# Patient Record
Sex: Female | Born: 1980 | Hispanic: Yes | Marital: Married | Smoking: Never smoker
Health system: Southern US, Community
[De-identification: ages and names within clinical notes are randomized; demographics above are authoritative.]

---

## 2005-05-09 ENCOUNTER — Emergency Department: Payer: Self-pay | Admitting: Unknown Physician Specialty

## 2005-07-19 ENCOUNTER — Ambulatory Visit: Payer: Self-pay | Admitting: Family Medicine

## 2005-10-30 ENCOUNTER — Ambulatory Visit: Payer: Self-pay

## 2005-12-25 ENCOUNTER — Inpatient Hospital Stay: Payer: Self-pay

## 2008-07-06 ENCOUNTER — Emergency Department: Payer: Self-pay | Admitting: Emergency Medicine

## 2012-12-16 ENCOUNTER — Ambulatory Visit: Payer: Self-pay | Admitting: Otolaryngology

## 2013-09-12 ENCOUNTER — Ambulatory Visit: Payer: Self-pay | Admitting: Urology

## 2015-10-22 ENCOUNTER — Other Ambulatory Visit: Payer: Self-pay | Admitting: Internal Medicine

## 2015-10-22 DIAGNOSIS — Z1231 Encounter for screening mammogram for malignant neoplasm of breast: Secondary | ICD-10-CM

## 2015-10-26 ENCOUNTER — Ambulatory Visit: Payer: Self-pay

## 2015-11-09 ENCOUNTER — Other Ambulatory Visit: Payer: Self-pay | Admitting: Internal Medicine

## 2015-11-09 ENCOUNTER — Ambulatory Visit
Admission: RE | Admit: 2015-11-09 | Discharge: 2015-11-09 | Disposition: A | Discharge status: Home / Self Care | Payer: 59 | Source: Ambulatory Visit | Attending: Internal Medicine | Admitting: Internal Medicine

## 2015-11-09 DIAGNOSIS — Z1231 Encounter for screening mammogram for malignant neoplasm of breast: Secondary | ICD-10-CM | POA: Insufficient documentation

## 2015-11-17 ENCOUNTER — Other Ambulatory Visit: Payer: Self-pay | Admitting: *Deleted

## 2015-11-17 ENCOUNTER — Inpatient Hospital Stay
Admission: RE | Admit: 2015-11-17 | Discharge: 2015-11-17 | Disposition: A | Discharge status: Home / Self Care | Payer: Self-pay | Source: Ambulatory Visit | Attending: *Deleted | Admitting: *Deleted

## 2015-11-17 DIAGNOSIS — Z9289 Personal history of other medical treatment: Secondary | ICD-10-CM

## 2015-11-19 ENCOUNTER — Other Ambulatory Visit: Payer: Self-pay | Admitting: Internal Medicine

## 2015-11-19 DIAGNOSIS — N6320 Unspecified lump in the left breast, unspecified quadrant: Secondary | ICD-10-CM

## 2015-11-23 ENCOUNTER — Ambulatory Visit
Admission: RE | Admit: 2015-11-23 | Discharge: 2015-11-23 | Disposition: A | Discharge status: Home / Self Care | Payer: 59 | Source: Ambulatory Visit | Attending: Internal Medicine | Admitting: Internal Medicine

## 2015-11-23 DIAGNOSIS — N6320 Unspecified lump in the left breast, unspecified quadrant: Secondary | ICD-10-CM

## 2015-11-23 DIAGNOSIS — N6321 Unspecified lump in the left breast, upper outer quadrant: Secondary | ICD-10-CM | POA: Diagnosis not present

## 2015-12-14 ENCOUNTER — Other Ambulatory Visit: Payer: Self-pay | Admitting: Internal Medicine

## 2015-12-14 DIAGNOSIS — N6320 Unspecified lump in the left breast, unspecified quadrant: Secondary | ICD-10-CM

## 2015-12-24 ENCOUNTER — Ambulatory Visit
Admission: RE | Admit: 2015-12-24 | Discharge: 2015-12-24 | Disposition: A | Discharge status: Home / Self Care | Payer: 59 | Source: Ambulatory Visit | Attending: Internal Medicine | Admitting: Internal Medicine

## 2015-12-24 DIAGNOSIS — N6320 Unspecified lump in the left breast, unspecified quadrant: Secondary | ICD-10-CM

## 2015-12-24 DIAGNOSIS — D242 Benign neoplasm of left breast: Secondary | ICD-10-CM | POA: Insufficient documentation

## 2015-12-24 HISTORY — PX: BREAST BIOPSY: SHX20

## 2015-12-28 LAB — SURGICAL PATHOLOGY

## 2016-06-07 ENCOUNTER — Other Ambulatory Visit: Payer: Self-pay | Admitting: Internal Medicine

## 2016-06-07 DIAGNOSIS — Z87898 Personal history of other specified conditions: Secondary | ICD-10-CM

## 2016-06-09 ENCOUNTER — Other Ambulatory Visit: Payer: Self-pay | Admitting: Internal Medicine

## 2016-06-09 DIAGNOSIS — D242 Benign neoplasm of left breast: Secondary | ICD-10-CM

## 2016-06-30 ENCOUNTER — Ambulatory Visit
Admission: RE | Admit: 2016-06-30 | Discharge: 2016-06-30 | Disposition: A | Discharge status: Home / Self Care | Payer: 59 | Source: Ambulatory Visit | Attending: Internal Medicine | Admitting: Internal Medicine

## 2016-06-30 DIAGNOSIS — D242 Benign neoplasm of left breast: Secondary | ICD-10-CM

## 2017-02-10 IMAGING — US US BREAST*L* LIMITED INC AXILLA
1 series · 13 of 25 positions shown · non-contrast
Comparison: Previous exam(s).

CLINICAL DATA: The patient was called back from screening
mammography due to a left breast mass.

EXAM:
2D DIGITAL DIAGNOSTIC LEFT MAMMOGRAM WITH ADJUNCT TOMO
ULTRASOUND LEFT BREAST

[Series 1: us breast*left* limited inc axilla · 0.07mm/px · 13 of 32 slices shown]
[im 1/32]
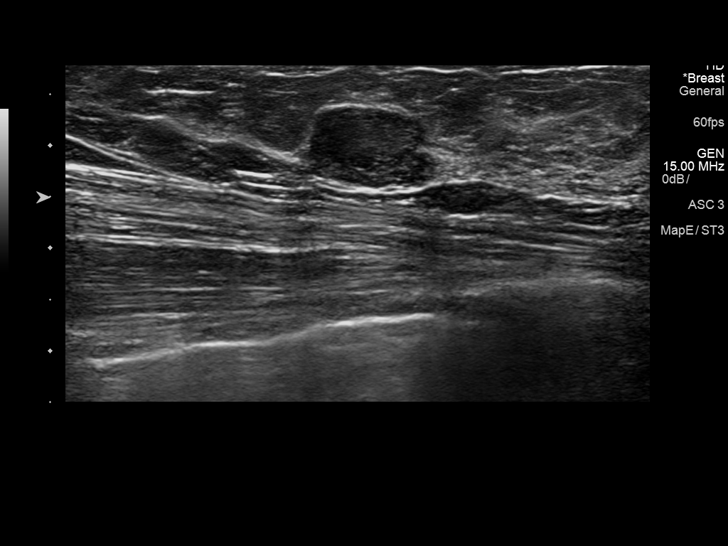
[im 3/32]
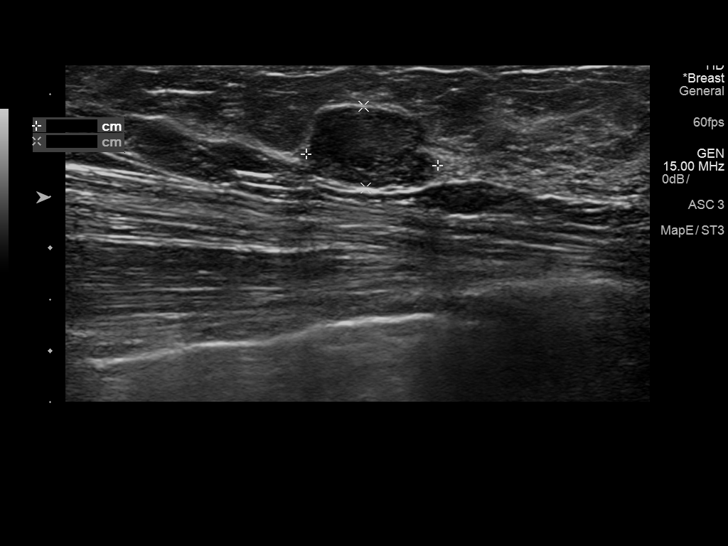
[im 6/32]
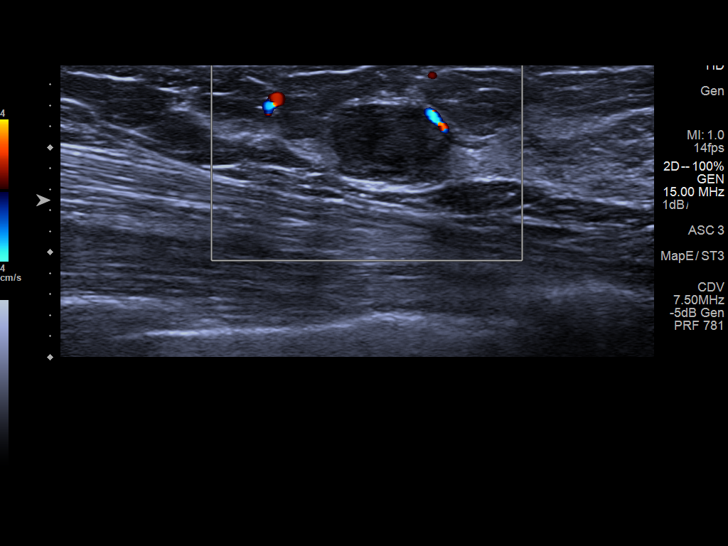
[im 8/32]
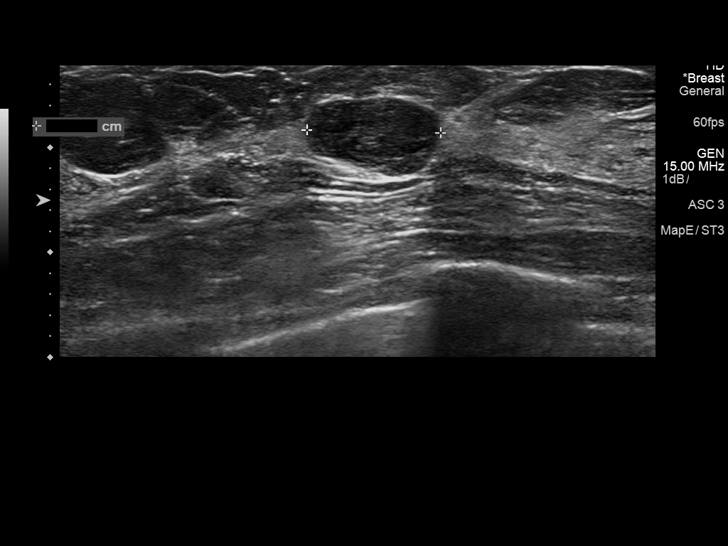
[im 11/32]
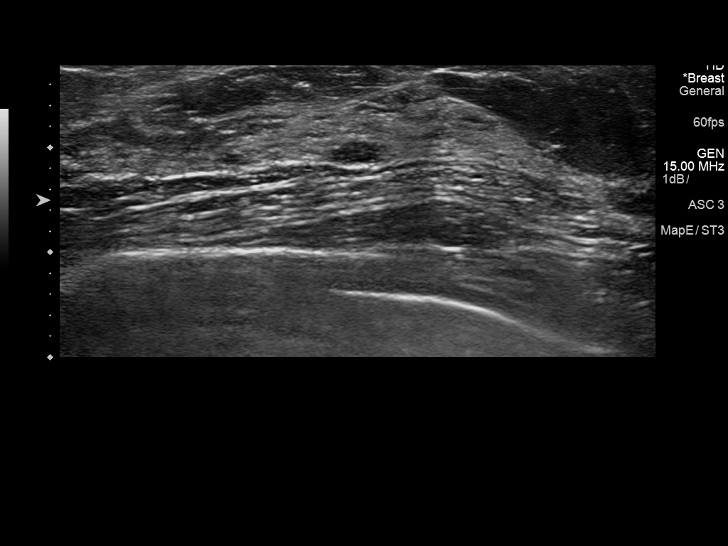
[im 13/32]
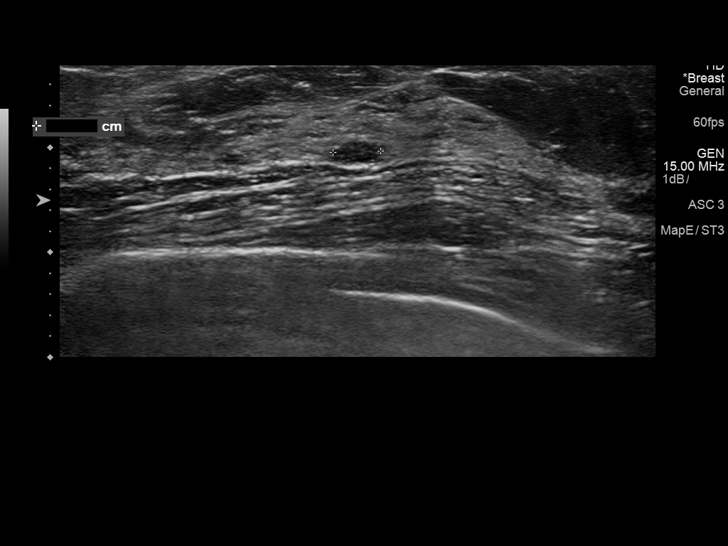
[im 16/32]
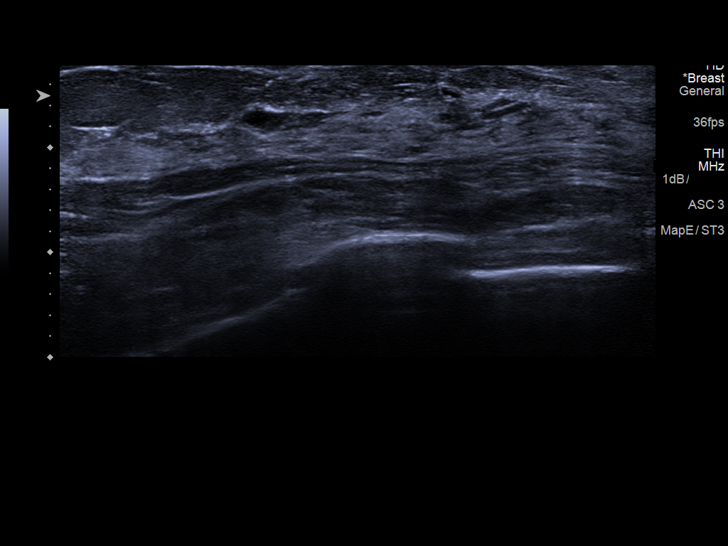
[im 19/32]
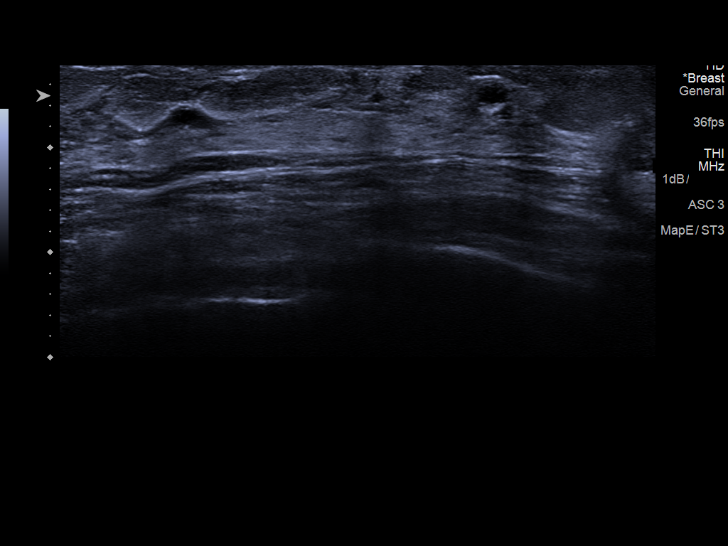
[im 21/32]
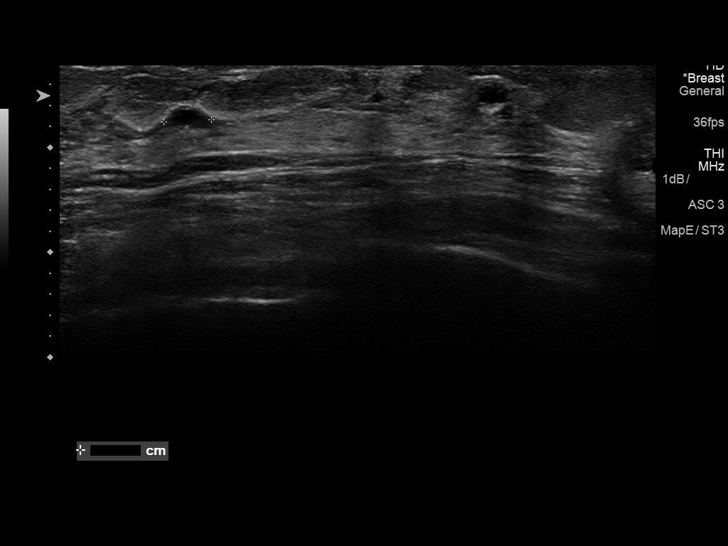
[im 24/32]
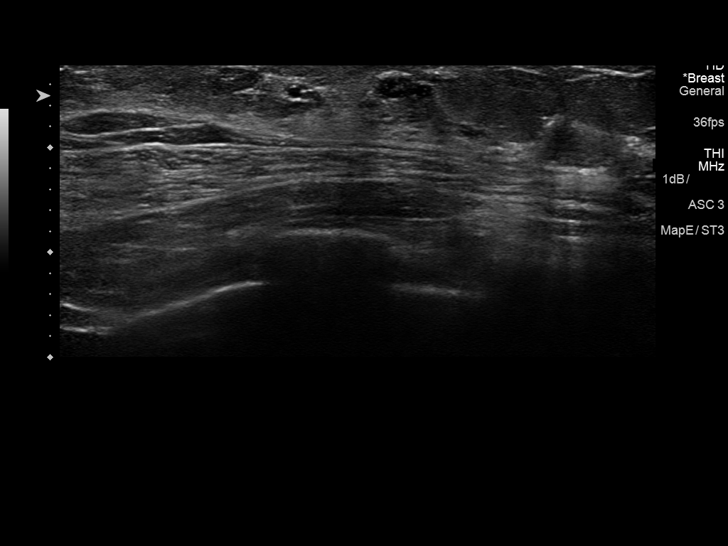
[im 26/32]
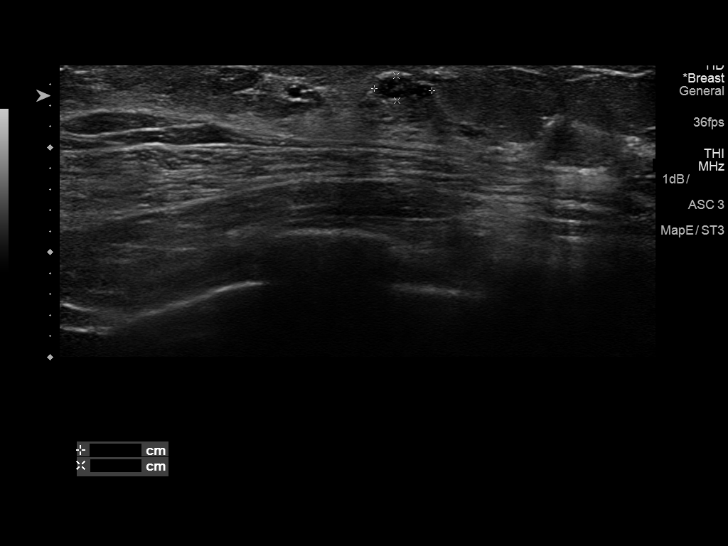
[im 29/32]
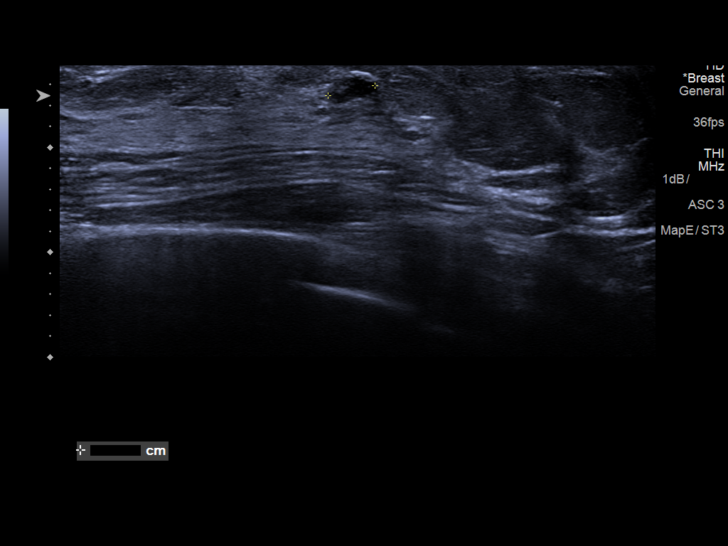
[im 32/32]
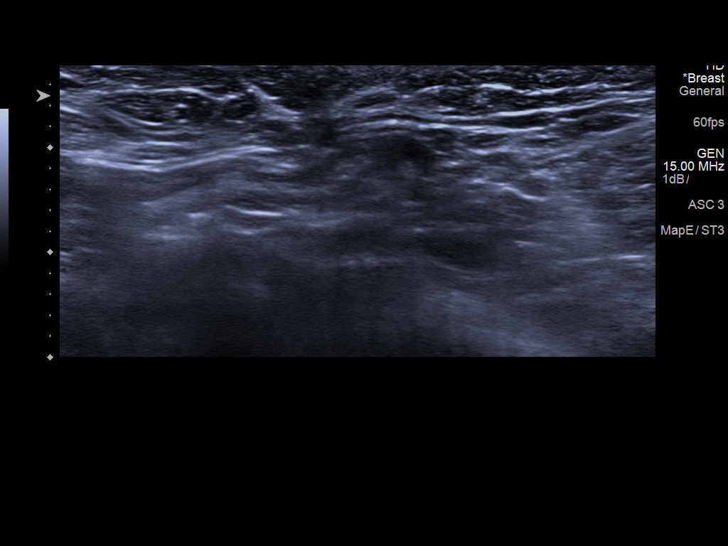

[13 of 25 positions shown; findings below may reference images not displayed]

ACR Breast Density Category c: The breast tissue is heterogeneously
dense, which may obscure small masses.
FINDINGS: There is a mass in the upper outer left breast which is lobulated
and isodense. No other suspicious findings.

On physical exam, no suspicious lumps are identified.

Targeted ultrasound is performed, showing a dominant mass at 1
o'clock, 5 cm from the nipple measuring 13 x 8 x 13 mm correlating
with the mammographic finding. It is primarily oval with gentle
lobulations and increased through transmission. An adjacent mass at
2 o'clock, 4 cm from the nipple measures 5 x 2 x 5 mm. Another mass
at [DATE], 4 cm from the nipple measures 5 x 3 x 5 mm. A final mass at
2 o'clock, 4 cm from the nipple measures 6 x 2 x 5 mm. No axillary
adenopathy.
IMPRESSION: There is a dominant mass in the upper outer left breast at 1
o'clock, 5 cm from the nipple. Several adjacent smaller nodules are
identified. I suspect all of the findings most likely represent
fibroadenomas. However, the findings are indeterminate.

RECOMMENDATION:
Recommend biopsying the dominant mass at 1 o'clock, 5 cm from the
nipple in the left breast. If it is benign, recommend six-month
follow-up ultrasound of the other adjacent smaller masses.

I have discussed the findings and recommendations with the patient.
Results were also provided in writing at the conclusion of the
visit. If applicable, a reminder letter will be sent to the patient
regarding the next appointment.

BI-RADS CATEGORY  4: Suspicious.

## 2018-03-14 ENCOUNTER — Encounter: Payer: Self-pay | Admitting: Emergency Medicine

## 2018-03-14 ENCOUNTER — Emergency Department: Payer: BLUE CROSS/BLUE SHIELD

## 2018-03-14 ENCOUNTER — Emergency Department
Admission: EM | Admit: 2018-03-14 | Discharge: 2018-03-14 | Disposition: A | Discharge status: Home / Self Care | Payer: BLUE CROSS/BLUE SHIELD | Attending: Emergency Medicine | Admitting: Emergency Medicine

## 2018-03-14 DIAGNOSIS — R6889 Other general symptoms and signs: Secondary | ICD-10-CM

## 2018-03-14 DIAGNOSIS — R0981 Nasal congestion: Secondary | ICD-10-CM | POA: Insufficient documentation

## 2018-03-14 DIAGNOSIS — R509 Fever, unspecified: Secondary | ICD-10-CM | POA: Diagnosis not present

## 2018-03-14 DIAGNOSIS — J111 Influenza due to unidentified influenza virus with other respiratory manifestations: Secondary | ICD-10-CM | POA: Diagnosis not present

## 2018-03-14 DIAGNOSIS — R05 Cough: Secondary | ICD-10-CM | POA: Diagnosis present

## 2018-03-14 LAB — INFLUENZA PANEL BY PCR (TYPE A & B)
Influenza A By PCR: NEGATIVE
Influenza B By PCR: NEGATIVE

## 2018-03-14 MED ORDER — ONDANSETRON 4 MG PO TBDP
4.0000 mg | ORAL_TABLET | Freq: Once | ORAL | Status: AC
  Administered 2018-03-14: 4 mg via ORAL
  Filled 2018-03-14: qty 1

## 2018-03-14 MED ORDER — BENZONATATE 200 MG PO CAPS: 200.0000 mg | ORAL_CAPSULE | Freq: Three times a day (TID) | ORAL | 0 refills | Status: DC | PRN

## 2018-03-14 MED ORDER — ONDANSETRON 4 MG PO TBDP: 4.0000 mg | ORAL_TABLET | Freq: Three times a day (TID) | ORAL | 0 refills | Status: DC | PRN

## 2018-03-14 MED ORDER — IBUPROFEN 800 MG PO TABS
800.0000 mg | ORAL_TABLET | Freq: Once | ORAL | Status: AC
  Administered 2018-03-14: 800 mg via ORAL
  Filled 2018-03-14: qty 1

## 2018-03-14 NOTE — ED Provider Notes (Signed)
[facility] Emergency Department Provider Note  ____________________________________________   First MD Initiated Contact with Patient 03/14/18 2241     (approximate)  I have reviewed the triage vital signs and the nursing notes.   HISTORY  Chief Complaint Cough and Headache    HPI Ana Rodriguez is a 38 y.o. female presents emergency department complaining of flulike symptoms.  She has had cough, fever, nasal congestion for a week.  She saw her PCP on Monday and was given a prescription for an ear infection.  She states the cough is become worse.  She was given Hycodan cough syrup and cefuroxime.  She denies vomiting or diarrhea.    History reviewed. No pertinent past medical history.  There are no active problems to display for this patient.   Past Surgical History:  Procedure Laterality Date  . BREAST BIOPSY Left 12/24/2015   Ultrasound guided biopsy    Prior to Admission medications   Medication Sig Start Date End Date Taking? Authorizing Provider  benzonatate (TESSALON) 200 MG capsule Take 1 capsule (200 mg total) by mouth 3 (three) times daily as needed for cough. 03/14/18   Caryn Section Linden Dolin, PA-C  ondansetron (ZOFRAN-ODT) 4 MG disintegrating tablet Take 1 tablet (4 mg total) by mouth every 8 (eight) hours as needed. 03/14/18   Versie Starks, PA-C    Allergies Patient has no known allergies.  History reviewed. No pertinent family history.  Social History Social History   Tobacco Use  . Smoking status: Never Smoker  . Smokeless tobacco: Never Used  Substance Use Topics  . Alcohol use: Not on file  . Drug use: Not on file    Review of Systems  Constitutional: Positive fever/chills, positive headache Eyes: No visual changes. ENT: No sore throat. Respiratory: Positive cough Genitourinary: Negative for dysuria. Musculoskeletal: Negative for back pain. Skin: Negative for  rash.    ____________________________________________   PHYSICAL EXAM:  VITAL SIGNS: ED Triage Vitals [03/14/18 2113]  Enc Vitals Group     BP 127/76     Pulse Rate 100     Resp 20     Temp 99 F (37.2 C)     Temp Source Oral     SpO2 99 %     Weight      Height      Head Circumference      Peak Flow      Pain Score      Pain Loc      Pain Edu?      Excl. in [place]?     Constitutional: Alert and oriented. Well appearing and in no acute distress. Eyes: Conjunctivae are normal.  Head: Atraumatic. Ears: Both ears are red and dull Nose: No congestion/rhinnorhea. Mouth/Throat: Mucous membranes are moist.  Throat is normal Neck:  supple no lymphadenopathy noted Cardiovascular: Normal rate, regular rhythm. Heart sounds are normal Respiratory: Normal respiratory effort.  No retractions, lun gs c t a  GU: deferred Musculoskeletal: FROM all extremities, warm and well perfused Neurologic:  Normal speech and language.  Skin:  Skin is warm, dry and intact. No rash noted. Psychiatric: Mood and affect are normal. Speech and behavior are normal.  ____________________________________________   LABS (all labs ordered are listed, but only abnormal results are displayed)  Labs Reviewed  INFLUENZA PANEL BY PCR (TYPE A & B)   ____________________________________________   ____________________________________________  RADIOLOGY  X-rays negative for pneumonia  ____________________________________________   PROCEDURES  Procedure(s) performed: Zofran ODT for  nausea, ibuprofen 800 mg p.o.  Procedures    ____________________________________________   INITIAL IMPRESSION / ASSESSMENT AND PLAN / ED COURSE  Pertinent labs & imaging results that were available during my care of the patient were reviewed by me and considered in my medical decision making (see chart for details).   Patient is a 38 year old female presents emergency department flulike symptoms.  Physical exam  both ears are red and swollen, cough is wet and congested.  She feels febrile.  Remainder is unremarkable  Chest x-ray is negative Influenza swab is pending  Patient was given ibuprofen 800 mg p.o. and Zofran 4 mg ODT    ----------------------------------------- 11:26 PM on 03/14/2018 -----------------------------------------  Influenza test is negative.  Explained test results to the patient and her husband.  She was given a prescription for Zofran ODT and Tessalon Perles.  She is to remain out of work until she has been fever free for 24 hours.  She is doing drink plenty of fluids.  Take Tylenol and ibuprofen.  Return if worsening  As part of my medical decision making, I reviewed the following data within the [facility] History obtained from family, Nursing notes reviewed and incorporated, Labs reviewed influenza test negative, Old chart reviewed, Radiograph reviewed chest x-ray is negative, Notes from prior ED visits and [place] Controlled Substance Database  ____________________________________________   FINAL CLINICAL IMPRESSION(S) / ED DIAGNOSES  Final diagnoses:  Flu-like symptoms      NEW MEDICATIONS STARTED DURING THIS VISIT:  New Prescriptions   BENZONATATE (TESSALON) 200 MG CAPSULE    Take 1 capsule (200 mg total) by mouth 3 (three) times daily as needed for cough.   ONDANSETRON (ZOFRAN-ODT) 4 MG DISINTEGRATING TABLET    Take 1 tablet (4 mg total) by mouth every 8 (eight) hours as needed.     Note:  This document was prepared using Dragon voice recognition software and may include unintentional dictation errors.    Versie Starks, PA-C 03/14/18 2327    Arta Silence, MD 03/18/18 [phone]

## 2018-03-14 NOTE — ED Notes (Signed)
Patient discharged to home per MD order. Patient in stable condition, and deemed medically cleared by ED provider for discharge. Discharge instructions reviewed with patient/family using "Teach Back"; verbalized understanding of medication education and administration, and information about follow-up care. Denies further concerns. ° °

## 2018-03-14 NOTE — ED Triage Notes (Signed)
Pt c/o cough, fever, nasal congestion x1 week. Pt went to PCP on Monday, diagnosed with ear infection. Prescribed Cough syrup and Cefuroxime. Pt to ED due to worsening cough.

## 2020-10-02 ENCOUNTER — Ambulatory Visit
Admission: EM | Admit: 2020-10-02 | Discharge: 2020-10-02 | Disposition: A | Discharge status: Home / Self Care | Payer: BLUE CROSS/BLUE SHIELD | Attending: Emergency Medicine | Admitting: Emergency Medicine

## 2020-10-02 ENCOUNTER — Other Ambulatory Visit: Payer: Self-pay

## 2020-10-02 ENCOUNTER — Encounter: Payer: Self-pay | Admitting: Emergency Medicine

## 2020-10-02 DIAGNOSIS — J069 Acute upper respiratory infection, unspecified: Secondary | ICD-10-CM | POA: Diagnosis not present

## 2020-10-02 DIAGNOSIS — H66002 Acute suppurative otitis media without spontaneous rupture of ear drum, left ear: Secondary | ICD-10-CM | POA: Diagnosis not present

## 2020-10-02 DIAGNOSIS — U071 COVID-19: Secondary | ICD-10-CM | POA: Insufficient documentation

## 2020-10-02 MED ORDER — PROMETHAZINE-DM 6.25-15 MG/5ML PO SYRP: 5.0000 mL | ORAL_SOLUTION | Freq: Four times a day (QID) | ORAL | 0 refills | Status: AC | PRN

## 2020-10-02 MED ORDER — AMOXICILLIN-POT CLAVULANATE 875-125 MG PO TABS: 1.0000 | ORAL_TABLET | Freq: Two times a day (BID) | ORAL | 0 refills | Status: AC

## 2020-10-02 MED ORDER — IPRATROPIUM BROMIDE 0.06 % NA SOLN: 2.0000 | Freq: Four times a day (QID) | NASAL | 12 refills | Status: AC

## 2020-10-02 MED ORDER — BENZONATATE 100 MG PO CAPS: 200.0000 mg | ORAL_CAPSULE | Freq: Three times a day (TID) | ORAL | 0 refills | Status: AC

## 2020-10-02 NOTE — ED Triage Notes (Signed)
Patient reports her cough started on Thursday.  Patient c/o sore throat, left ear pain and runny nose that started yesterday.  Patient denies fevers.

## 2020-10-02 NOTE — Discharge Instructions (Addendum)
Take the Augmentin twice daily for 10 days with food for treatment of your ear infection.  Take an over-the-counter probiotic 1 hour after each dose of antibiotic to prevent diarrhea.  Use over-the-counter Tylenol and ibuprofen as needed for pain or fever.  Place a hot water bottle, or heating pad, underneath your pillowcase at night to help dilate up your ear and aid in pain relief as well as resolution of the infection.  Use the Atrovent nasal spray, 2 squirts in each nostril every 6 hours, as needed for runny nose and postnasal drip.  Use the Tessalon Perles every 8 hours during the day.  Take them with a small sip of water.  They may give you some numbness to the base of your tongue or a metallic taste in your mouth, this is normal.  Use the Promethazine DM cough syrup at bedtime for cough and congestion.  It will make you drowsy so do not take it during the day.  Isolate at home pending the results of your COVID test.  If you test positive then you will have to quarantine for 5 days from the start of your symptoms.  After 5 days you can break quarantine if your symptoms have improved and you have not had a fever for 24 hours without taking Tylenol or ibuprofen.  If you develop any increased shortness of breath-especially at rest, you are unable to speak in full sentences, or is a late sign your lips are turning blue you need to go the ER for evaluation.   Return for reevaluation or see your primary care provider for any new or worsening symptoms.

## 2020-10-02 NOTE — ED Provider Notes (Signed)
[facility]    CSN: [id number] Arrival date & time: 10/02/20  0801      History   Chief Complaint Chief Complaint  Patient presents with   Otalgia    left   Sore Throat   Nasal Congestion   Cough    HPI Jeneane Taylen Joiner is a 40 y.o. female.   HPI  40 year old female here for evaluation of respiratory complaints.  Patient reports that 2 days ago she developed a nonproductive cough and then yesterday she developed pain in her left ear, sharp sore throat, and runny nose with clear nasal discharge.  She is also endorsing mild intermittent headache, which is not present now, and some shortness of breath and wheezing.  Patient can speak in full sentences and she is not in any form of respiratory distress.  She denies fever, abdominal pain, nausea, vomiting, diarrhea, body aches, or known sick contacts  History reviewed. No pertinent past medical history.  There are no problems to display for this patient.   Past Surgical History:  Procedure Laterality Date   BREAST BIOPSY Left 12/24/2015   Ultrasound guided biopsy    OB History   No obstetric history on file.      Home Medications    Prior to Admission medications   Medication Sig Start Date End Date Taking? Authorizing Provider  amoxicillin-clavulanate (AUGMENTIN) 875-125 MG tablet Take 1 tablet by mouth every 12 (twelve) hours for 10 days. 10/02/20 10/12/20 Yes Margarette Canada, NP  benzonatate (TESSALON) 100 MG capsule Take 2 capsules (200 mg total) by mouth every 8 (eight) hours. 10/02/20  Yes Margarette Canada, NP  ipratropium (ATROVENT) 0.06 % nasal spray Place 2 sprays into both nostrils 4 (four) times daily. 10/02/20  Yes Margarette Canada, NP  promethazine-dextromethorphan (PROMETHAZINE-DM) 6.25-15 MG/5ML syrup Take 5 mLs by mouth 4 (four) times daily as needed. 10/02/20  Yes Margarette Canada, NP    Family History History reviewed. No pertinent family history.  Social History Social History   Tobacco Use    Smoking status: Never   Smokeless tobacco: Never  Vaping Use   Vaping Use: Never used  Substance Use Topics   Alcohol use: Yes   Drug use: Never     Allergies   Doxycycline and Nitrofurantoin   Review of Systems Review of Systems  Constitutional:  Negative for activity change, appetite change and fever.  HENT:  Positive for congestion, ear pain, postnasal drip, rhinorrhea and sore throat. Negative for ear discharge.   Respiratory:  Positive for cough, shortness of breath and wheezing.   Gastrointestinal:  Negative for diarrhea, nausea and vomiting.  Musculoskeletal:  Negative for arthralgias and myalgias.  Skin:  Negative for rash.  Neurological:  Positive for headaches.  Hematological: Negative.   Psychiatric/Behavioral: Negative.      Physical Exam Triage Vital Signs ED Triage Vitals  Enc Vitals Group     BP      Pulse      Resp      Temp      Temp src      SpO2      Weight      Height      Head Circumference      Peak Flow      Pain Score      Pain Loc      Pain Edu?      Excl. in [place]?    No data found.  Updated Vital Signs BP 103/72 (BP Location: Left Arm)  Pulse 95   Temp 99.8 F (37.7 C) (Oral)   Resp 14   Ht '5\' 4"'$  (1.626 m)   Wt 120 lb (54.4 kg)   LMP 09/11/2020 (Approximate)   SpO2 100%   BMI 20.60 kg/m   Visual Acuity Right Eye Distance:   Left Eye Distance:   Bilateral Distance:    Right Eye Near:   Left Eye Near:    Bilateral Near:     Physical Exam Vitals and nursing note reviewed.  Constitutional:      General: She is not in acute distress.    Appearance: Normal appearance. She is normal weight. She is not ill-appearing.  HENT:     Head: Normocephalic and atraumatic.     Right Ear: Ear canal and external ear normal. There is no impacted cerumen.     Left Ear: Ear canal and external ear normal. There is no impacted cerumen.     Nose: Congestion and rhinorrhea present.     Mouth/Throat:     Mouth: Mucous membranes are moist.      Pharynx: Oropharynx is clear. Posterior oropharyngeal erythema present.  Cardiovascular:     Rate and Rhythm: Normal rate and regular rhythm.     Pulses: Normal pulses.     Heart sounds: Normal heart sounds. No murmur heard.   No gallop.  Pulmonary:     Effort: Pulmonary effort is normal.     Breath sounds: Normal breath sounds. No wheezing, rhonchi or rales.  Musculoskeletal:     Cervical back: Normal range of motion and neck supple.  Lymphadenopathy:     Cervical: No cervical adenopathy.  Skin:    General: Skin is warm and dry.     Capillary Refill: Capillary refill takes less than 2 seconds.     Findings: No erythema or rash.  Neurological:     General: No focal deficit present.     Mental Status: She is alert and oriented to person, place, and time.  Psychiatric:        Mood and Affect: Mood normal.        Behavior: Behavior normal.        Thought Content: Thought content normal.        Judgment: Judgment normal.     UC Treatments / Results  Labs (all labs ordered are listed, but only abnormal results are displayed) Labs Reviewed  SARS CORONAVIRUS 2 (TAT 6-24 HRS)    EKG   Radiology No results found.  Procedures Procedures (including critical care time)  Medications Ordered in UC Medications - No data to display  Initial Impression / Assessment and Plan / UC Course  I have reviewed the triage vital signs and the nursing notes.  Pertinent labs & imaging results that were available during my care of the patient were reviewed by me and considered in my medical decision making (see chart for details).  Patient is a nontoxic-appearing 40 year old female here for evaluation of runny nose, sore throat, cough, and left ear pain.  She denies any fever or sick contacts.  Patient's physical exam reveals an erythematous and injected left tympanic membrane with a loss of landmarks.  The right tympanic membrane has mild erythema and a serous effusion.  Both external  auditory canals are clear.  Nasal mucosa is erythematous and edematous with clear nasal discharge.  Oropharyngeal exam reveals posterior oropharyngeal erythema with clear postnasal drip.  No cervical lymphadenopathy appreciated exam.  Cardiopulmonary exam reveals clear lung sounds in all fields.  COVID swab collected at triage and is pending.  We will treat patient for URI and otitis media.  I have prescribed the patient Augmentin 875 twice daily for 10 days for the otitis media, Atrovent nasal spray to help with nasal congestion, runny nose, postnasal drip, Tessalon Perles and Promethazine DM cough syrup to help with cough.  ER and return precautions reviewed with patient.   Final Clinical Impressions(s) / UC Diagnoses   Final diagnoses:  Upper respiratory tract infection, unspecified type  Non-recurrent acute suppurative otitis media of left ear without spontaneous rupture of tympanic membrane     Discharge Instructions      Take the Augmentin twice daily for 10 days with food for treatment of your ear infection.  Take an over-the-counter probiotic 1 hour after each dose of antibiotic to prevent diarrhea.  Use over-the-counter Tylenol and ibuprofen as needed for pain or fever.  Place a hot water bottle, or heating pad, underneath your pillowcase at night to help dilate up your ear and aid in pain relief as well as resolution of the infection.  Use the Atrovent nasal spray, 2 squirts in each nostril every 6 hours, as needed for runny nose and postnasal drip.  Use the Tessalon Perles every 8 hours during the day.  Take them with a small sip of water.  They may give you some numbness to the base of your tongue or a metallic taste in your mouth, this is normal.  Use the Promethazine DM cough syrup at bedtime for cough and congestion.  It will make you drowsy so do not take it during the day.  Isolate at home pending the results of your COVID test.  If you test positive then you will have to  quarantine for 5 days from the start of your symptoms.  After 5 days you can break quarantine if your symptoms have improved and you have not had a fever for 24 hours without taking Tylenol or ibuprofen.  If you develop any increased shortness of breath-especially at rest, you are unable to speak in full sentences, or is a late sign your lips are turning blue you need to go the ER for evaluation.   Return for reevaluation or see your primary care provider for any new or worsening symptoms.        ED Prescriptions     Medication Sig Dispense Auth. Provider   benzonatate (TESSALON) 100 MG capsule Take 2 capsules (200 mg total) by mouth every 8 (eight) hours. 21 capsule Margarette Canada, NP   amoxicillin-clavulanate (AUGMENTIN) 875-125 MG tablet Take 1 tablet by mouth every 12 (twelve) hours for 10 days. 20 tablet Margarette Canada, NP   ipratropium (ATROVENT) 0.06 % nasal spray Place 2 sprays into both nostrils 4 (four) times daily. 15 mL Margarette Canada, NP   promethazine-dextromethorphan (PROMETHAZINE-DM) 6.25-15 MG/5ML syrup Take 5 mLs by mouth 4 (four) times daily as needed. 118 mL Margarette Canada, NP      PDMP not reviewed this encounter.   Margarette Canada, NP 10/02/20 [phone]

## 2020-10-03 LAB — SARS CORONAVIRUS 2 (TAT 6-24 HRS): SARS Coronavirus 2: POSITIVE — AB

## 2021-04-11 ENCOUNTER — Other Ambulatory Visit: Payer: Self-pay | Admitting: Internal Medicine

## 2021-04-11 DIAGNOSIS — Z1231 Encounter for screening mammogram for malignant neoplasm of breast: Secondary | ICD-10-CM

## 2021-04-13 ENCOUNTER — Other Ambulatory Visit: Payer: Self-pay | Admitting: Obstetrics and Gynecology

## 2021-04-13 DIAGNOSIS — Z1231 Encounter for screening mammogram for malignant neoplasm of breast: Secondary | ICD-10-CM

## 2021-06-21 ENCOUNTER — Ambulatory Visit
Admission: RE | Admit: 2021-06-21 | Discharge: 2021-06-21 | Disposition: A | Discharge status: Home / Self Care | Payer: BLUE CROSS/BLUE SHIELD | Source: Ambulatory Visit | Attending: Obstetrics and Gynecology | Admitting: Obstetrics and Gynecology

## 2021-06-21 DIAGNOSIS — Z1231 Encounter for screening mammogram for malignant neoplasm of breast: Secondary | ICD-10-CM | POA: Diagnosis not present

## 2022-04-20 ENCOUNTER — Other Ambulatory Visit: Payer: Self-pay | Admitting: Obstetrics and Gynecology

## 2022-04-20 DIAGNOSIS — Z1231 Encounter for screening mammogram for malignant neoplasm of breast: Secondary | ICD-10-CM

## 2023-01-03 IMAGING — MG MM DIGITAL SCREENING BILAT W/ TOMO AND CAD
8 series · 9 of 24 positions shown · non-contrast
Comparison: Previous exam(s).

CLINICAL DATA: Screening.

EXAM:
DIGITAL SCREENING BILATERAL MAMMOGRAM WITH TOMOSYNTHESIS AND CAD
TECHNIQUE: Bilateral screening digital craniocaudal and mediolateral oblique
mammograms were obtained. Bilateral screening digital breast
tomosynthesis was performed. The images were evaluated with
computer-aided detection.

[R CC synth-2D]
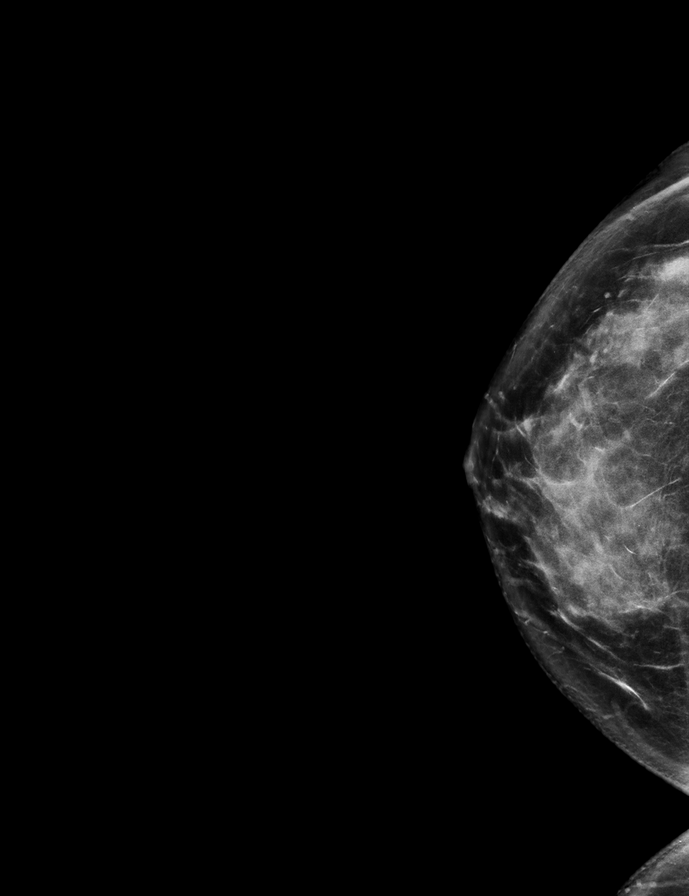

[L CC synth-2D]
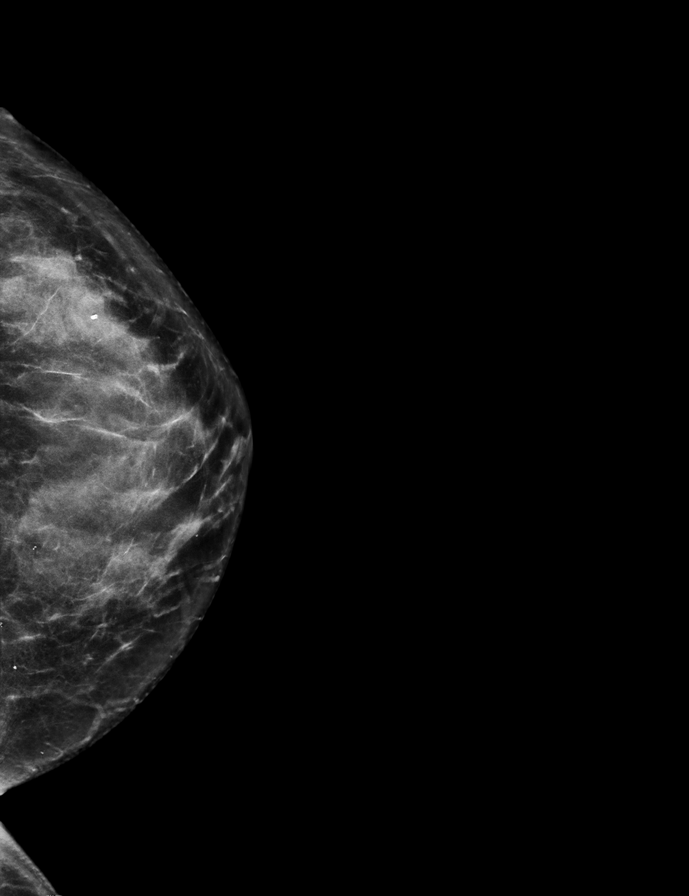

[L MLO synth-2D]
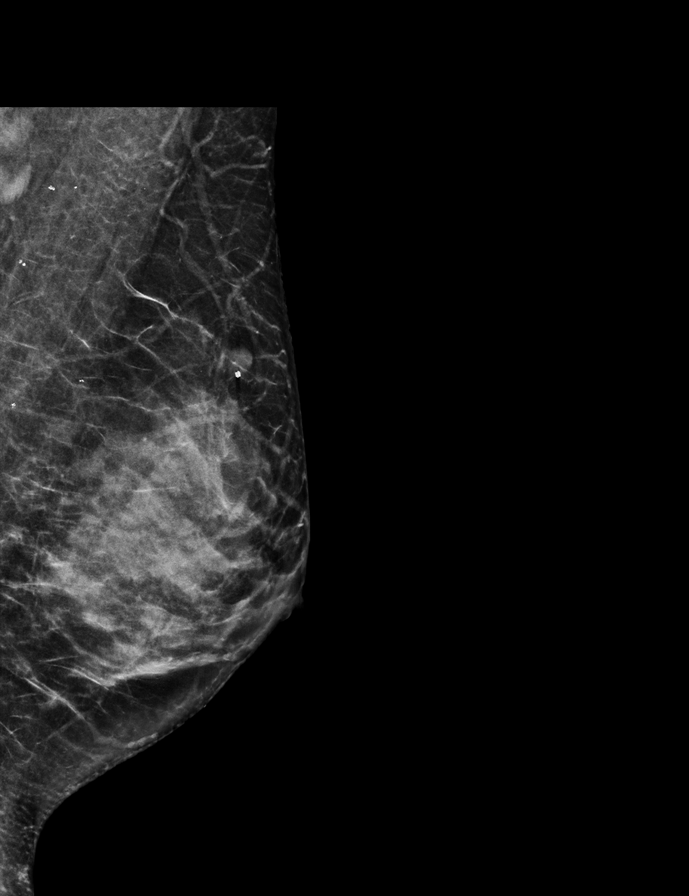

[R MLO synth-2D]
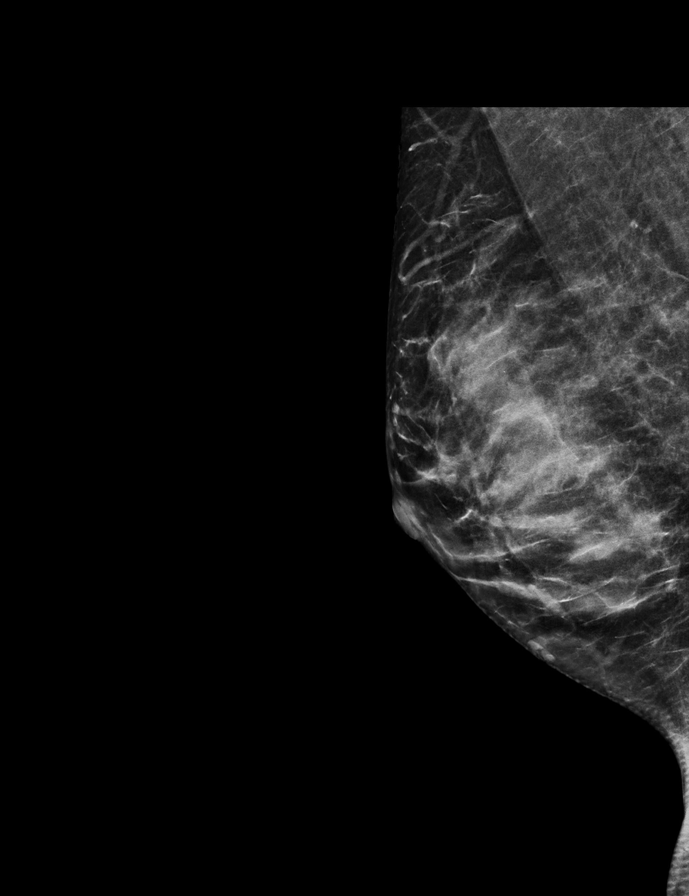

[R MLO tomo · 2 of 56 frames shown]
[frame 19/56]
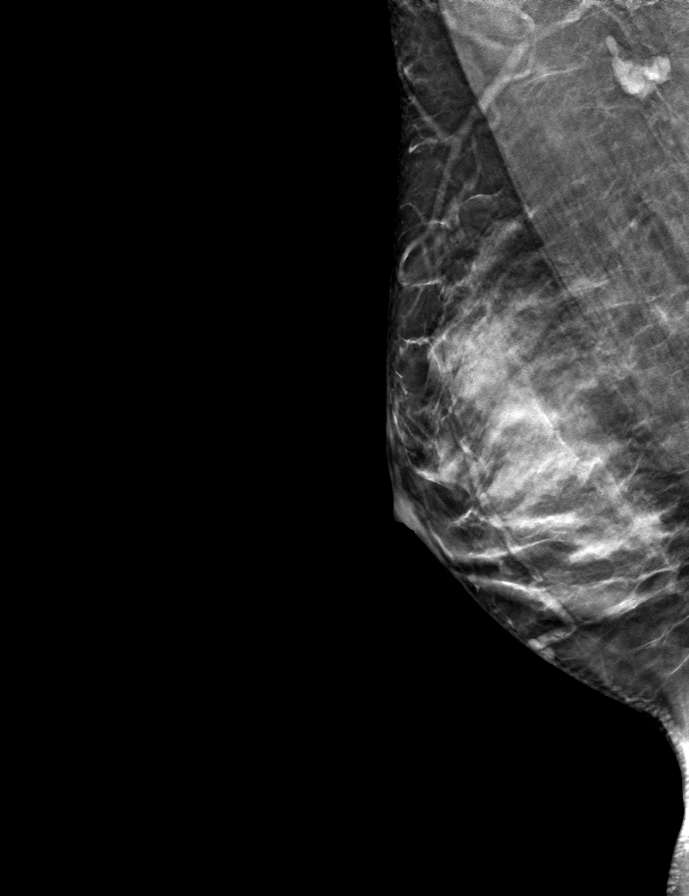
[frame 29/56]
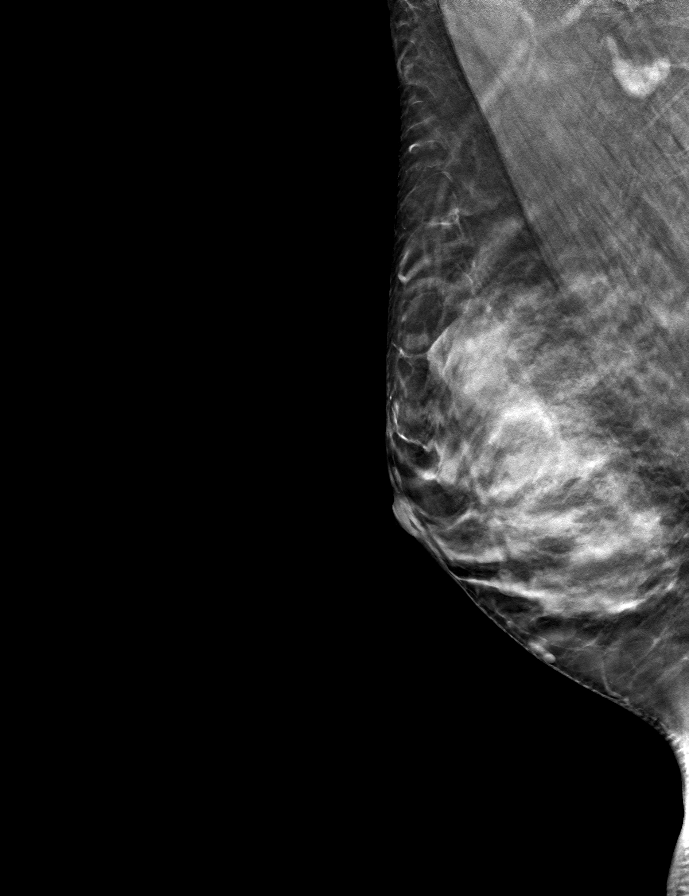

[L MLO tomo · tomo slice 29/56.0]
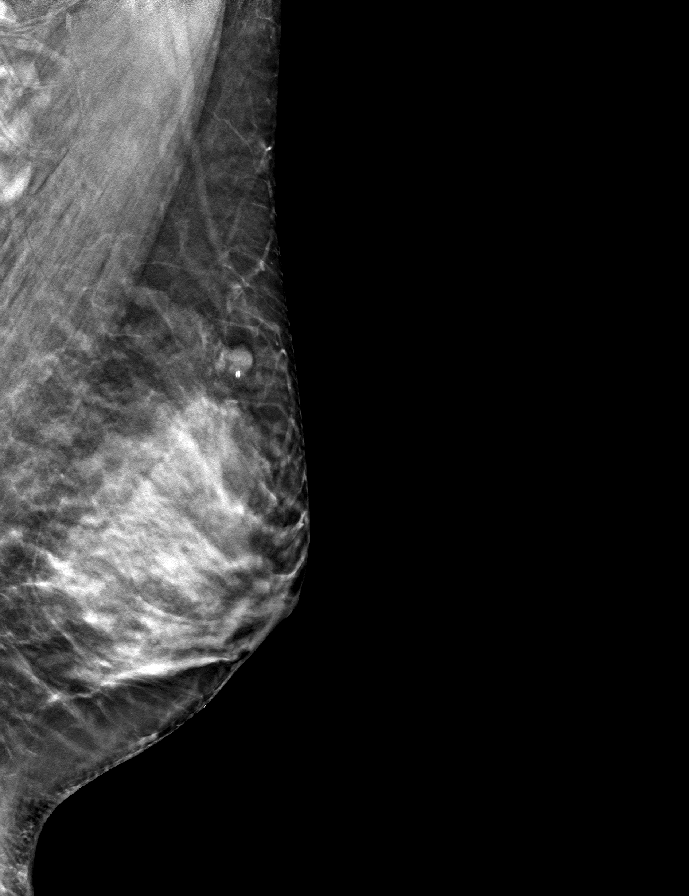

[L CC tomo · tomo slice 33/64.0]
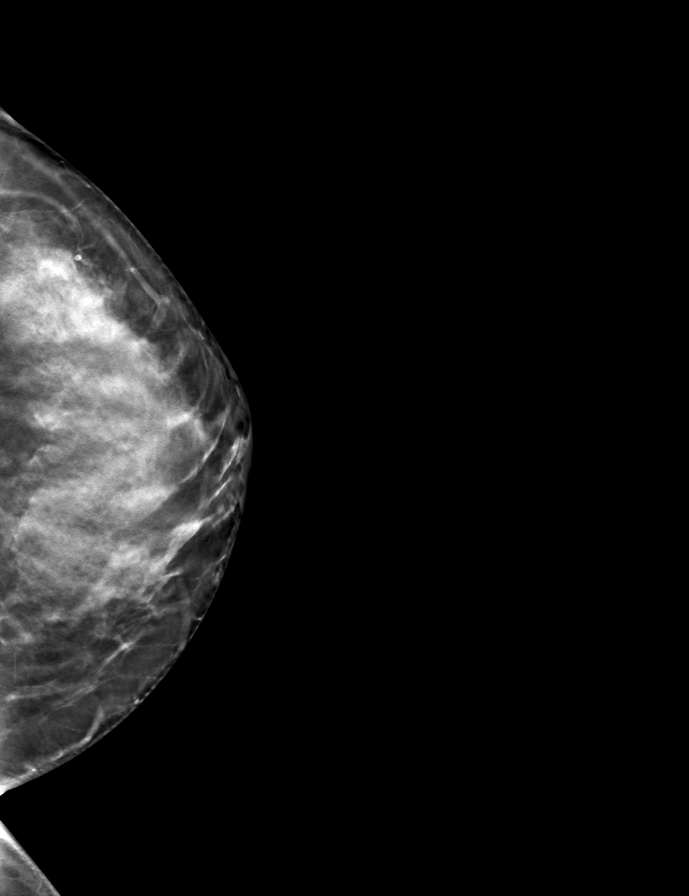

[R CC tomo · tomo slice 33/66.0]
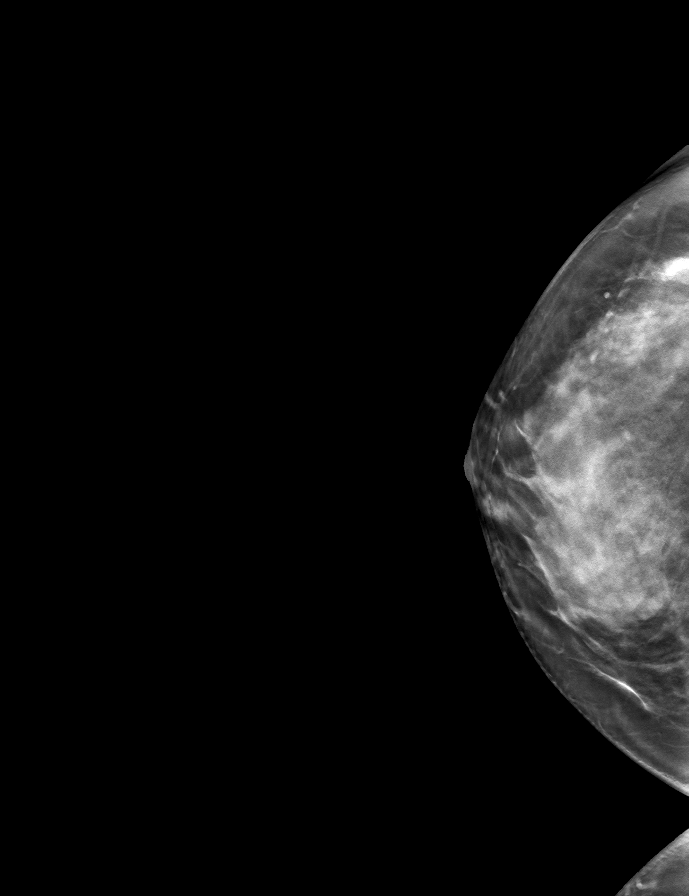

[9 of 24 positions shown; findings below may reference images not displayed]

ACR Breast Density Category d: The breast tissue is extremely dense,
which lowers the sensitivity of mammography
FINDINGS: There are no findings suspicious for malignancy.
IMPRESSION: No mammographic evidence of malignancy. A result letter of this
screening mammogram will be mailed directly to the patient.

RECOMMENDATION:
Screening mammogram in one year. (Code:TA-V-WV9)

BI-RADS CATEGORY  1: Negative.
